# Patient Record
Sex: Male | Born: 1990
Health system: Southern US, Community
[De-identification: ages and names within clinical notes are randomized; demographics above are authoritative.]

## PROBLEM LIST (undated history)

## (undated) DIAGNOSIS — B20 Human immunodeficiency virus [HIV] disease: Secondary | ICD-10-CM

## (undated) HISTORY — PX: OTHER SURGICAL HISTORY: SHX169

## (undated) HISTORY — DX: Human immunodeficiency virus (HIV) disease: B20

## (undated) HISTORY — PX: TYMPANOSTOMY TUBE PLACEMENT: SHX32

---

## 2016-06-19 ENCOUNTER — Emergency Department (HOSPITAL_BASED_OUTPATIENT_CLINIC_OR_DEPARTMENT_OTHER): Payer: Self-pay

## 2016-06-19 ENCOUNTER — Encounter (HOSPITAL_BASED_OUTPATIENT_CLINIC_OR_DEPARTMENT_OTHER): Payer: Self-pay | Admitting: Emergency Medicine

## 2016-06-19 ENCOUNTER — Emergency Department (HOSPITAL_BASED_OUTPATIENT_CLINIC_OR_DEPARTMENT_OTHER)
Admission: EM | Admit: 2016-06-19 | Discharge: 2016-06-20 | Disposition: A | Discharge status: Home / Self Care | Payer: Self-pay | Attending: Emergency Medicine | Admitting: Emergency Medicine

## 2016-06-19 DIAGNOSIS — Y929 Unspecified place or not applicable: Secondary | ICD-10-CM | POA: Insufficient documentation

## 2016-06-19 DIAGNOSIS — W228XXA Striking against or struck by other objects, initial encounter: Secondary | ICD-10-CM | POA: Insufficient documentation

## 2016-06-19 DIAGNOSIS — Y999 Unspecified external cause status: Secondary | ICD-10-CM | POA: Insufficient documentation

## 2016-06-19 DIAGNOSIS — Y939 Activity, unspecified: Secondary | ICD-10-CM | POA: Insufficient documentation

## 2016-06-19 DIAGNOSIS — S20211A Contusion of right front wall of thorax, initial encounter: Secondary | ICD-10-CM | POA: Insufficient documentation

## 2016-06-19 MED ORDER — IBUPROFEN 800 MG PO TABS: 800.0000 mg | ORAL_TABLET | Freq: Three times a day (TID) | ORAL | 0 refills | Status: AC

## 2016-06-19 MED FILL — IBUPROFEN 800 MG TABLET: 800 | 7 days supply | Qty: 21 | Fill #0

## 2016-06-19 NOTE — ED Triage Notes (Signed)
Pt states he hit is rib on a counter about a week ago and started having pain about 4 days ago when he coughs and takes a deep breath.

## 2016-06-19 NOTE — ED Provider Notes (Signed)
[facility] Provider Note   CSN: [id number] Arrival date & time: 06/19/16  1301     History   Chief Complaint Chief Complaint  Patient presents with  . Chest Pain    HPI Bill Tran is a 25 y.o. male.  Patient is 25 yo M with no signficant PMH, presenting with right-sided rib pain after accidentally hitting his ribs on a counter approximately 1 week ago. The pain is intermittent, sharp, worse with taking a deep breath, with some associated shortness of breath. Denies fever, chills, cough, wheezing, hemoptysis, chest pain, swelling or pain in extremities. Also reports "breaking out with acne" over face, bilateral arms, and back for several days.      History reviewed. No pertinent past medical history.  There are no active problems to display for this patient.   Past Surgical History:  Procedure Laterality Date  . tubs         Home Medications    Prior to Admission medications   Medication Sig Start Date End Date Taking? Authorizing Provider  ibuprofen (ADVIL,MOTRIN) 800 MG tablet Take 1 tablet (800 mg total) by mouth 3 (three) times daily. 06/19/16   Sacha Topor F de Villier II, PA    Family History History reviewed. No pertinent family history.  Social History Social History  Substance Use Topics  . Smoking status: Never Smoker  . Smokeless tobacco: Never Used  . Alcohol use Yes     Allergies   Review of patient's allergies indicates no known allergies.   Review of Systems Review of Systems  All other systems reviewed and are negative.    Physical Exam Updated Vital Signs BP 110/67   Pulse 87   Temp 98.3 F (36.8 C) (Oral)   Resp 18   Ht 5\' 6"  (1.676 m)   Wt 63.5 kg   SpO2 98%   BMI 22.60 kg/m   Physical Exam  Constitutional: He appears well-developed and well-nourished. No distress.  HENT:  Head: Normocephalic and atraumatic.  Mouth/Throat: Oropharynx is clear and moist.  Eyes: Conjunctivae are normal.  Neck: Normal range  of motion.  Cardiovascular: Normal rate, regular rhythm, normal heart sounds and intact distal pulses.   Pulmonary/Chest: Effort normal and breath sounds normal. No respiratory distress. He exhibits tenderness (Tenderness to palpation of right lateral ribs, no crepitus).  Symmetric thoracic expansion, no flail chest noted  Abdominal: Soft. There is no tenderness.  Musculoskeletal: Normal range of motion. He exhibits no edema or tenderness.  Neurological: He is alert.  Skin: Skin is warm and dry.  Diffuse comedones and erythematous papules noted to face, bilateral arms, back, and chest  Psychiatric: He has a normal mood and affect.  Nursing note and vitals reviewed.    ED Treatments / Results  Labs (all labs ordered are listed, but only abnormal results are displayed) Labs Reviewed - No data to display  EKG  EKG Interpretation None       Radiology Dg Ribs Unilateral W/chest Right  Result Date: 06/19/2016 CLINICAL DATA:  Patient with right anterior rib pain for 4 days. Initial encounter. EXAM: RIGHT RIBS AND CHEST - 3+ VIEW COMPARISON:  None. FINDINGS: Normal cardiac and mediastinal contours. No consolidative pulmonary opacities. No pleural effusion or pneumothorax. No displaced rib fracture identified. IMPRESSION: No displaced rib fracture.  No acute cardiopulmonary process. Electronically Signed   By: Annia Belt M.D.   On: 06/19/2016 14:19    Procedures Procedures (including critical care time)  Medications Ordered in ED Medications -  No data to display   Initial Impression / Assessment and Plan / ED Course  I have reviewed the triage vital signs and the nursing notes.  Pertinent labs & imaging results that were available during my care of the patient were reviewed by me and considered in my medical decision making (see chart for details).  Clinical Course   Patient is 25 yo M complaining of right-sided rib pain after hitting his ribs on a counter 1 week ago. Pain is  reproducible on palpation of ribs. Negative x-ray of ribs and chest. Low risk factors for PE or acute pulmonary pathology. Likely rib contusion. Stable for d/c home with prescription for ibuprofen. Provided referral to [facility]. Advised to return to ED for worsening symptoms including shortness of breath, cough, chest pain, or skin changes.  Final Clinical Impressions(s) / ED Diagnoses   Final diagnoses:  Rib contusion, right, initial encounter    New Prescriptions New Prescriptions   IBUPROFEN (ADVIL,MOTRIN) 800 MG TABLET    Take 1 tablet (800 mg total) by mouth 3 (three) times daily.     Ivonne AndrewDaryl F de EldoradoVillier II, GeorgiaPA 06/19/16 2123    Charlynne Panderavid Hsienta Yao, MD 06/21/16 [phone]

## 2016-06-19 NOTE — ED Notes (Signed)
Patient transported to X-ray 

## 2017-04-24 IMAGING — CR DG RIBS W/ CHEST 3+V*R*
3 series · 3 of 3 positions shown · non-contrast
Comparison: None.

CLINICAL DATA: Patient with right anterior rib pain for 4 days.
Initial encounter.

EXAM:
RIGHT RIBS AND CHEST - 3+ VIEW

[w chest pa]
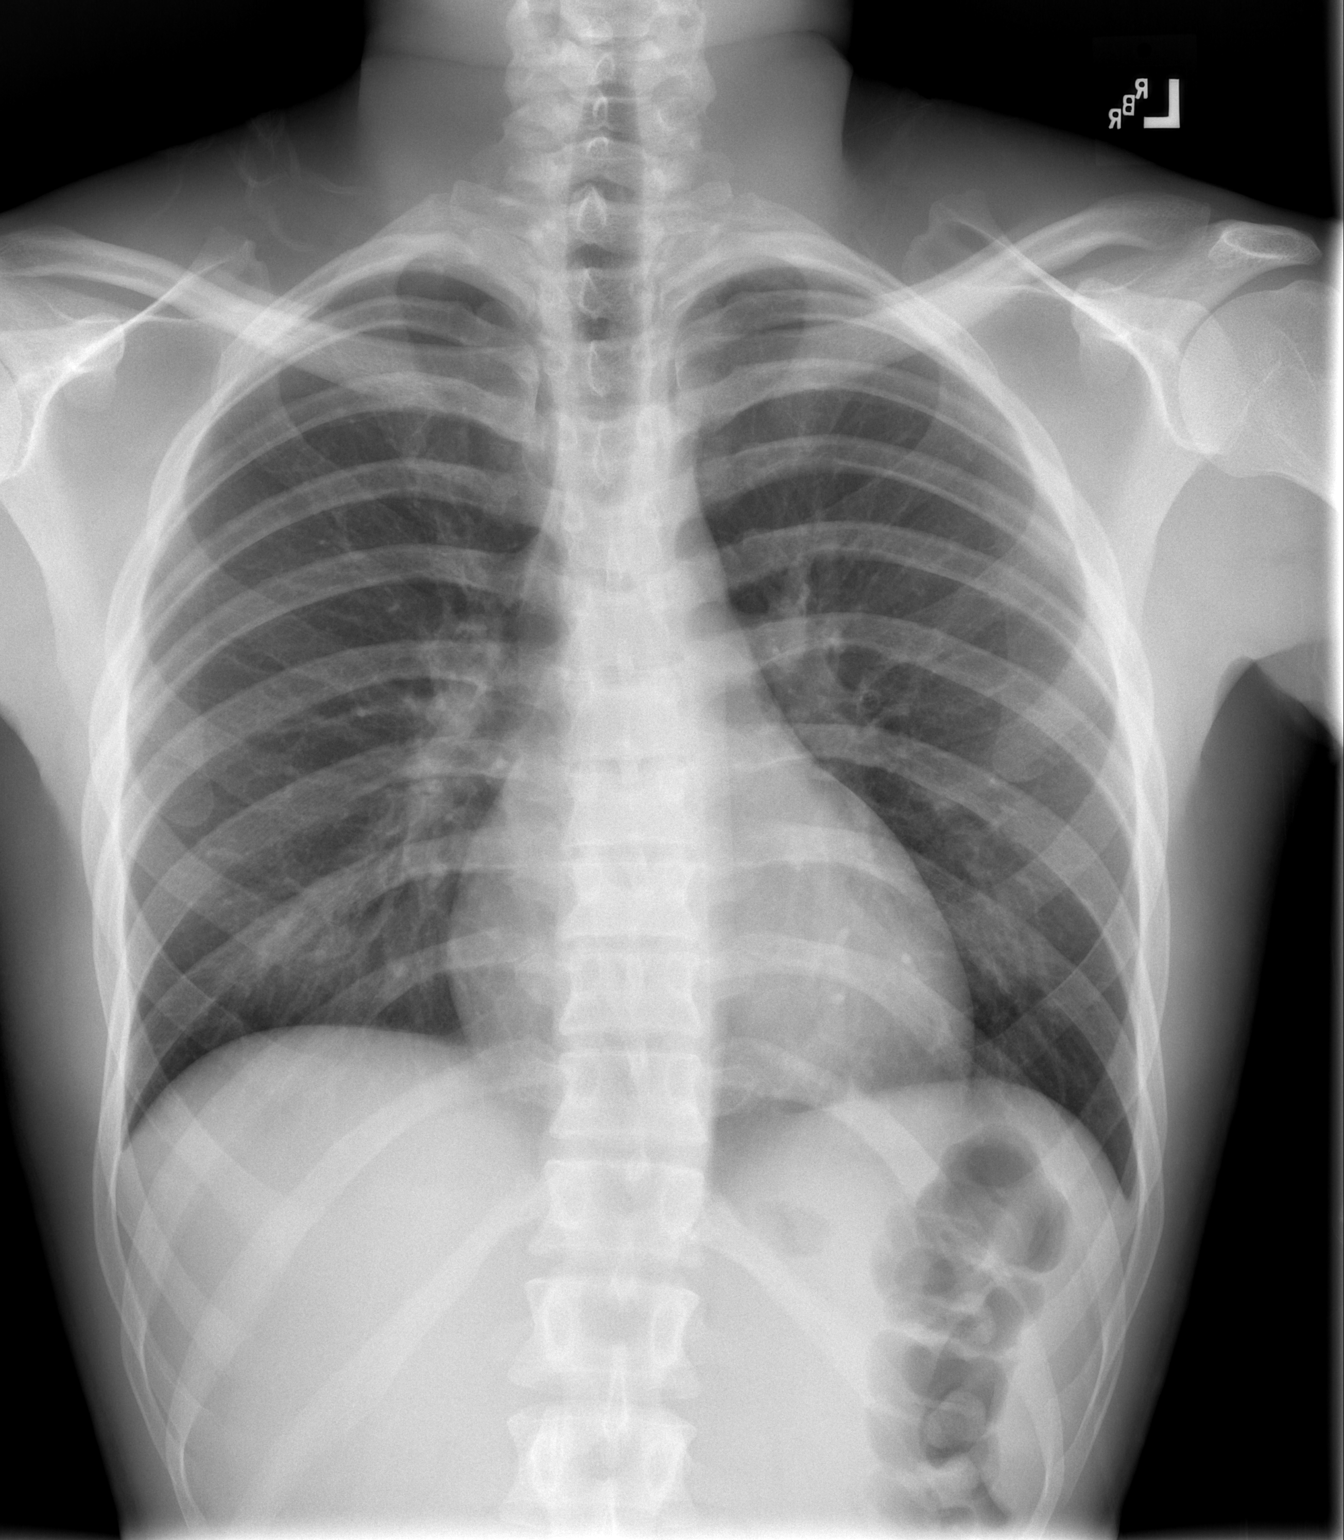

[w ribs ap/pa upper right]
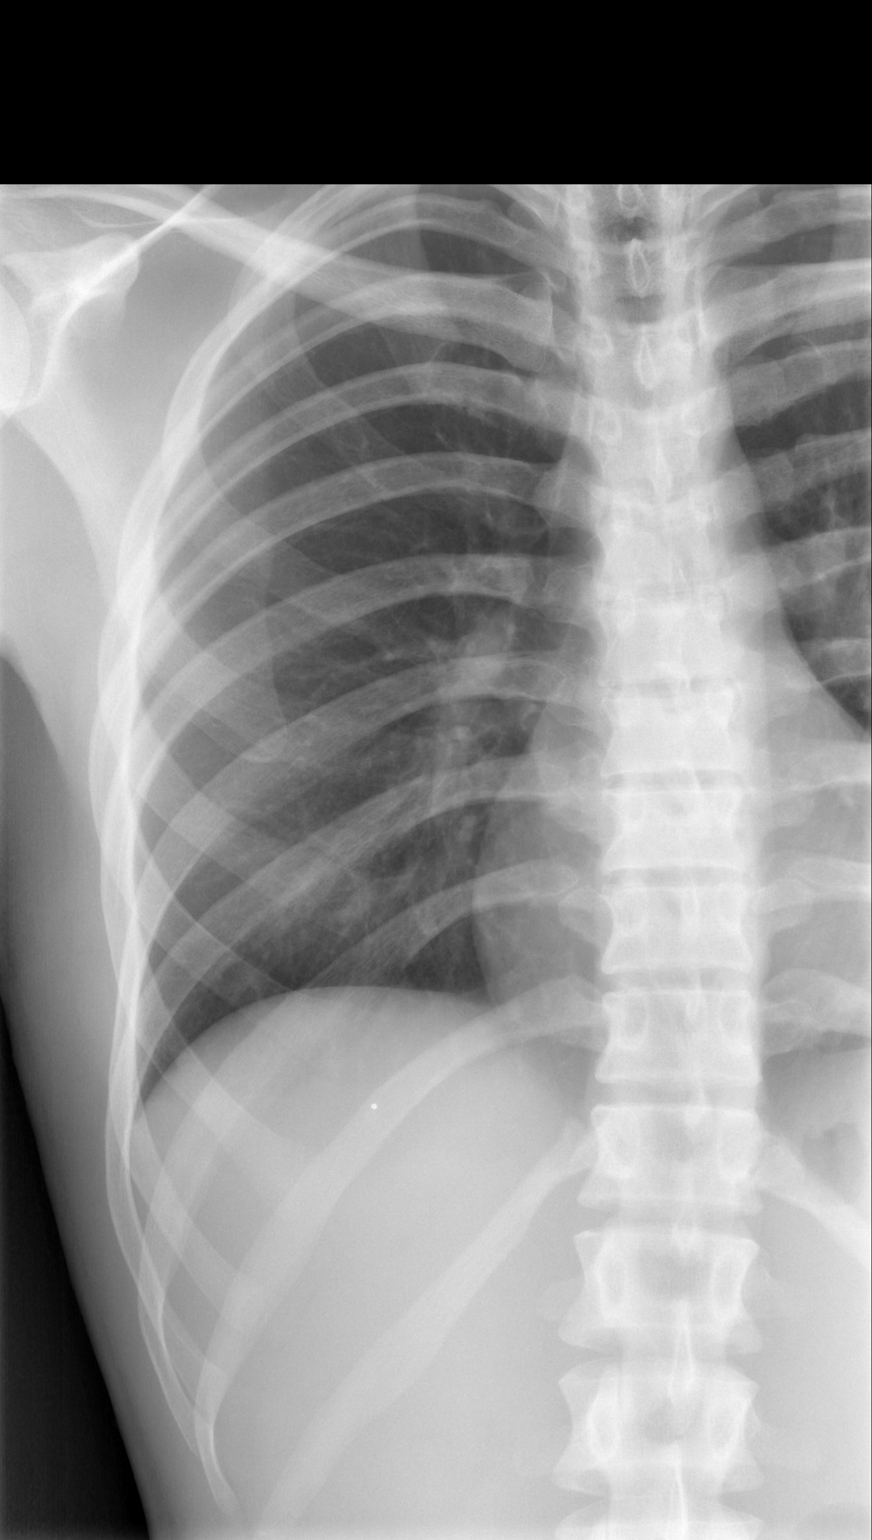

[w ribs oblique right *]
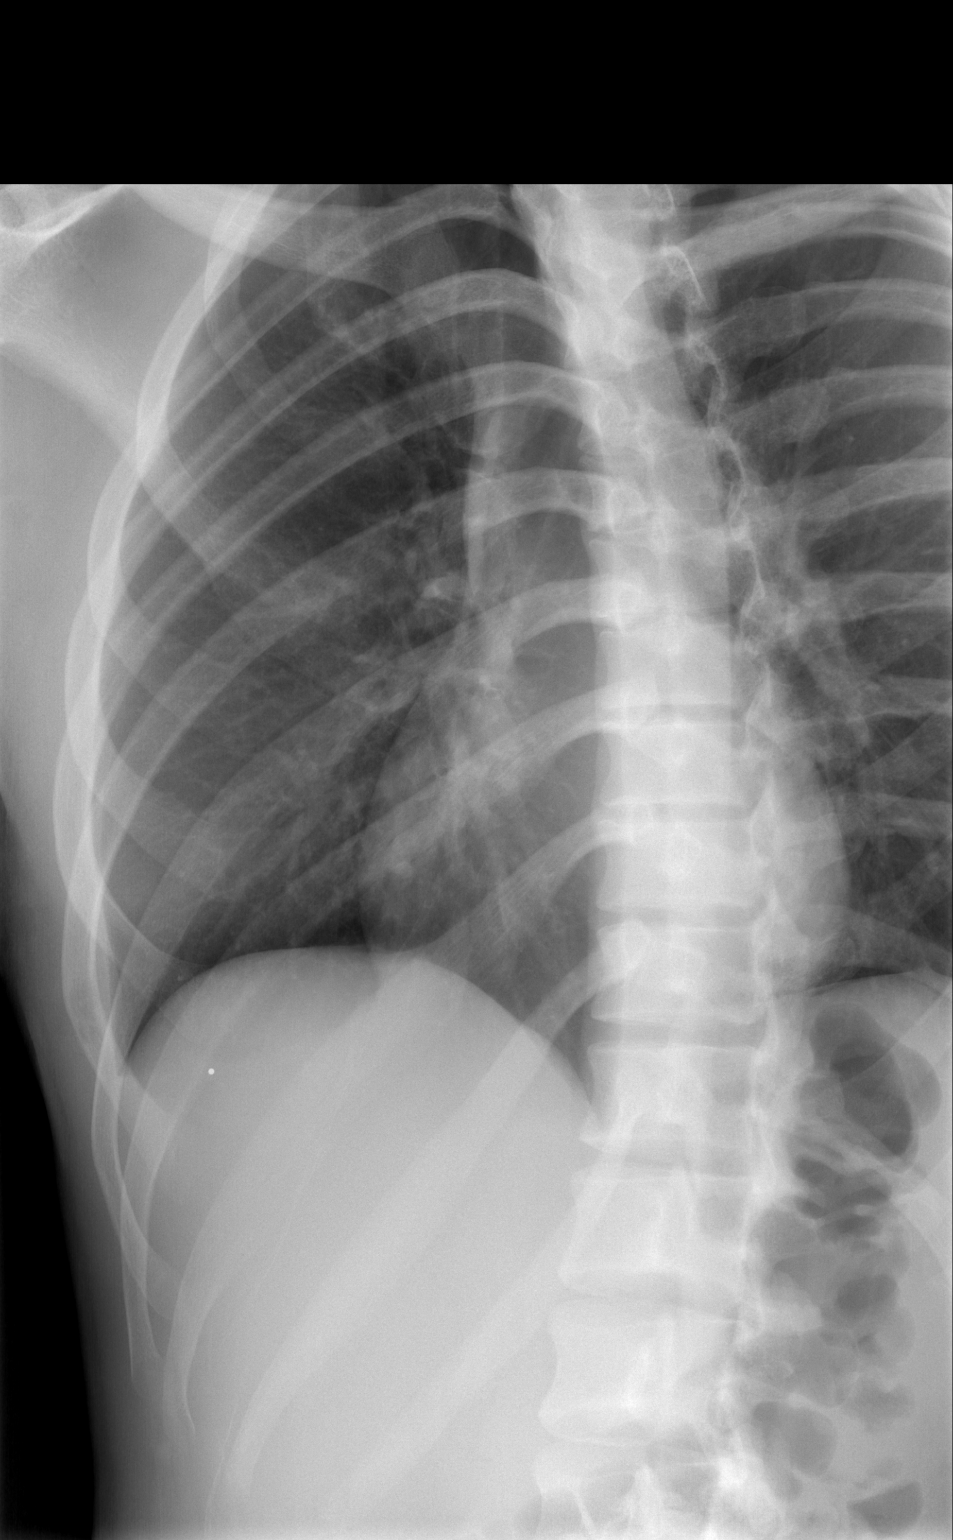

[3 of 3 positions shown; findings below may reference images not displayed]

FINDINGS: Normal cardiac and mediastinal contours. No consolidative pulmonary
opacities. No pleural effusion or pneumothorax. No displaced rib
fracture identified.
IMPRESSION: No displaced rib fracture.  No acute cardiopulmonary process.

## 2017-12-04 ENCOUNTER — Other Ambulatory Visit: Payer: Self-pay

## 2017-12-04 ENCOUNTER — Other Ambulatory Visit: Payer: Self-pay | Admitting: *Deleted

## 2017-12-04 ENCOUNTER — Ambulatory Visit: Payer: Self-pay

## 2017-12-04 ENCOUNTER — Encounter: Payer: Self-pay | Admitting: Infectious Diseases

## 2017-12-04 DIAGNOSIS — Z79899 Other long term (current) drug therapy: Secondary | ICD-10-CM

## 2017-12-04 DIAGNOSIS — B20 Human immunodeficiency virus [HIV] disease: Secondary | ICD-10-CM

## 2017-12-04 DIAGNOSIS — Z113 Encounter for screening for infections with a predominantly sexual mode of transmission: Secondary | ICD-10-CM

## 2017-12-05 LAB — URINALYSIS
Bilirubin Urine: NEGATIVE
Glucose, UA: NEGATIVE
HGB URINE DIPSTICK: NEGATIVE
Ketones, ur: NEGATIVE
Leukocytes, UA: NEGATIVE
NITRITE: NEGATIVE
PH: 8 (ref 5.0–8.0)
Protein, ur: NEGATIVE
Specific Gravity, Urine: 1.01 (ref 1.001–1.03)

## 2017-12-05 LAB — T-HELPER CELL (CD4) - (RCID CLINIC ONLY)
CD4 % Helper T Cell: 21 % — ABNORMAL LOW (ref 33–55)
CD4 T Cell Abs: 430 /uL (ref 400–2700)

## 2017-12-05 LAB — URINE CYTOLOGY ANCILLARY ONLY
Chlamydia: NEGATIVE
Neisseria Gonorrhea: NEGATIVE

## 2017-12-06 LAB — QUANTIFERON-TB GOLD PLUS
NIL: 0.05 [IU]/mL
QUANTIFERON-TB GOLD PLUS: NEGATIVE
TB1-NIL: 0.02 IU/mL
TB2-NIL: 0 IU/mL

## 2017-12-07 LAB — CBC WITH DIFFERENTIAL/PLATELET
BASOS PCT: 1 %
Basophils Absolute: 52 cells/uL (ref 0–200)
EOS ABS: 68 {cells}/uL (ref 15–500)
Eosinophils Relative: 1.3 %
HCT: 39.7 % (ref 38.5–50.0)
Hemoglobin: 13.8 g/dL (ref 13.2–17.1)
Lymphs Abs: 1820 cells/uL (ref 850–3900)
MCH: 31 pg (ref 27.0–33.0)
MCHC: 34.8 g/dL (ref 32.0–36.0)
MCV: 89.2 fL (ref 80.0–100.0)
MONOS PCT: 9.6 %
MPV: 10.1 fL (ref 7.5–12.5)
Neutro Abs: 2761 cells/uL (ref 1500–7800)
Neutrophils Relative %: 53.1 %
PLATELETS: 233 10*3/uL (ref 140–400)
RBC: 4.45 10*6/uL (ref 4.20–5.80)
RDW: 13.7 % (ref 11.0–15.0)
Total Lymphocyte: 35 %
WBC mixed population: 499 cells/uL (ref 200–950)
WBC: 5.2 10*3/uL (ref 3.8–10.8)

## 2017-12-07 LAB — COMPLETE METABOLIC PANEL WITH GFR
AG Ratio: 1.4 (calc) (ref 1.0–2.5)
ALBUMIN MSPROF: 4.3 g/dL (ref 3.6–5.1)
ALT: 20 U/L (ref 9–46)
AST: 19 U/L (ref 10–40)
Alkaline phosphatase (APISO): 59 U/L (ref 40–115)
BILIRUBIN TOTAL: 0.8 mg/dL (ref 0.2–1.2)
BUN: 11 mg/dL (ref 7–25)
CO2: 27 mmol/L (ref 20–32)
CREATININE: 0.67 mg/dL (ref 0.60–1.35)
Calcium: 9.1 mg/dL (ref 8.6–10.3)
Chloride: 106 mmol/L (ref 98–110)
GFR, EST AFRICAN AMERICAN: 154 mL/min/{1.73_m2} (ref >=60)
GFR, EST NON AFRICAN AMERICAN: 133 mL/min/{1.73_m2} (ref >=60)
GLUCOSE: 90 mg/dL (ref 65–99)
Globulin: 3.1 g/dL (calc) (ref 1.9–3.7)
Potassium: 4 mmol/L (ref 3.5–5.3)
Sodium: 139 mmol/L (ref 135–146)
TOTAL PROTEIN: 7.4 g/dL (ref 6.1–8.1)

## 2017-12-07 LAB — HEPATITIS A ANTIBODY, TOTAL: Hepatitis A AB,Total: NONREACTIVE

## 2017-12-07 LAB — HLA B*5701: HLA-B*5701 w/rflx HLA-B High: NEGATIVE

## 2017-12-07 LAB — LIPID PANEL
Cholesterol: 163 mg/dL (ref <200)
HDL: 61 mg/dL (ref >40)
LDL Cholesterol (Calc): 88 mg/dL (calc)
Non-HDL Cholesterol (Calc): 102 mg/dL (calc) (ref <130)
TRIGLYCERIDES: 55 mg/dL (ref <150)
Total CHOL/HDL Ratio: 2.7 (calc) (ref <5.0)

## 2017-12-07 LAB — HEPATITIS B CORE ANTIBODY, TOTAL: Hep B Core Total Ab: NONREACTIVE

## 2017-12-07 LAB — HEPATITIS B SURFACE ANTIGEN: HEP B S AG: NONREACTIVE

## 2017-12-07 LAB — HEPATITIS B SURFACE ANTIBODY,QUALITATIVE: HEP B S AB: REACTIVE — AB

## 2017-12-07 LAB — RPR: RPR Ser Ql: REACTIVE — AB

## 2017-12-07 LAB — FLUORESCENT TREPONEMAL AB(FTA)-IGG-BLD: Fluorescent Treponemal ABS: REACTIVE — AB

## 2017-12-07 LAB — HEPATITIS C ANTIBODY
Hepatitis C Ab: NONREACTIVE
SIGNAL TO CUT-OFF: 0.03 (ref <1.00)

## 2017-12-07 LAB — RPR TITER: RPR Titer: 1:32 {titer} — ABNORMAL HIGH

## 2017-12-09 ENCOUNTER — Telehealth: Payer: Self-pay | Admitting: *Deleted

## 2017-12-09 NOTE — Telephone Encounter (Signed)
Thank you for following up on this. He has had what I would consider an expected drop in titer with appropriate treatment. Will monitor further RPR titers in the next 3-6 months.   No further action at this time.

## 2017-12-09 NOTE — Telephone Encounter (Signed)
[facility]. Patient was listed as a contacted and tested 2/28 (1:64).  He received 1 of 1 dose bicillin 2.4 million units on 3/5. He had no signs/symptoms.  Andree CossHowell, Griffin Dewilde M, RN

## 2017-12-09 NOTE — Telephone Encounter (Signed)
-----   Message from Blanchard KelchStephanie N Dixon, NP sent at 12/06/2017  2:31 PM EDT ----- Syphilis test positive with high titer - can you please call the patient to determine if he has had any treatment? If he has never had treatment and he has no symptoms will need to treat with 2.4 million units IM bicillin weekly x 3. If he has had treatment we can discuss further at upcoming appointment.

## 2017-12-20 LAB — HIV-1 RNA ULTRAQUANT REFLEX TO GENTYP+
HIV 1 RNA QUANT: 2630 {copies}/mL — AB
HIV-1 RNA QUANT, LOG: 3.42 {Log_copies}/mL — AB

## 2017-12-20 LAB — RFLX HIV-1 INTEGRASE GENOTYPE: HIV-1 Genotype: DETECTED — AB

## 2017-12-23 ENCOUNTER — Encounter: Payer: Self-pay | Admitting: Infectious Diseases

## 2017-12-23 ENCOUNTER — Ambulatory Visit (INDEPENDENT_AMBULATORY_CARE_PROVIDER_SITE_OTHER): Payer: Self-pay | Admitting: Pharmacist Clinician (PhC)/ Clinical Pharmacy Specialist

## 2017-12-23 ENCOUNTER — Ambulatory Visit (INDEPENDENT_AMBULATORY_CARE_PROVIDER_SITE_OTHER): Payer: Self-pay | Admitting: Infectious Diseases

## 2017-12-23 VITALS — BP 121/80 | HR 70 | Temp 97.7°F | Ht 66.0 in | Wt 136.0 lb

## 2017-12-23 DIAGNOSIS — Z8619 Personal history of other infectious and parasitic diseases: Secondary | ICD-10-CM

## 2017-12-23 DIAGNOSIS — B20 Human immunodeficiency virus [HIV] disease: Secondary | ICD-10-CM

## 2017-12-23 DIAGNOSIS — Z0000 Encounter for general adult medical examination without abnormal findings: Secondary | ICD-10-CM

## 2017-12-23 DIAGNOSIS — Z23 Encounter for immunization: Secondary | ICD-10-CM

## 2017-12-23 MED ORDER — BICTEGRAVIR-EMTRICITAB-TENOFOV 50-200-25 MG PO TABS: 1.0000 | ORAL_TABLET | Freq: Every day | ORAL | 1 refills | Status: DC

## 2017-12-23 MED ORDER — BICTEGRAVIR-EMTRICITAB-TENOFOV 50-200-25 MG PO TABS: 1.0000 | ORAL_TABLET | Freq: Every day | ORAL | 5 refills | Status: DC

## 2017-12-23 MED FILL — BIKTARVY 50-200-25 MG TABS: 50-200-25 | 30 days supply | Qty: 30 | Fill #0

## 2017-12-23 NOTE — Progress Notes (Signed)
Name: Bill Tran  DOB: 1990/12/01 MRN: [id number] PCP: Patient, No Pcp Per   Chief Complaint  Patient presents with  . New Patient (Initial Visit)    New HIV      Patient Active Problem List   Diagnosis Date Noted  . HIV (human immunodeficiency virus infection) (HCC) 12/23/2017  . History of syphilis 12/23/2017  . Healthcare maintenance 12/23/2017     Brief Narrative:  Bill Tran is a 27 y.o. male with HIV infection. Originally diagnosed with HIV in 11/2017. History of OIs: none. HIV Risk: MSM.   Previous Regimens:   none  Genotype:   11/2017 - K103N   Subjective:  Bill Tran  is here today to establish care for his newly diagnosed HIV infection. He learned of his positive status in March of this year after he went for STI testing where he was also found to have a positive syphilis titer and [facility] He last tested negative for HIV in 2017. No symptoms regarding his health that he is concerned with aside from some general itching that happens on occasion about 1x daily that has been going on for a week. He is in a long term relationship with a male partner for the last 3 years whom is also a patient in our PrEP clinic.   He has no other significant pmhx to add today.   Chaske works at [employer] full time and enjoys traveling; presently trying to plan a trip to [place]. He has not had the flu vaccine this year and has not in years past.   Review of Systems  Constitutional: Negative for chills, fever, malaise/fatigue and weight loss.  HENT: Negative for sore throat.        No dental problems  Respiratory: Negative for cough and sputum production.   Cardiovascular: Negative for chest pain and leg swelling.  Gastrointestinal: Negative for abdominal pain, diarrhea and vomiting.  Genitourinary: Negative for dysuria and flank pain.  Musculoskeletal: Negative for joint pain, myalgias and neck pain.  Skin: Negative  for rash.  Neurological: Negative for dizziness, tingling and headaches.  Psychiatric/Behavioral: Negative for depression and substance abuse. The patient is not nervous/anxious and does not have insomnia.     Past Medical History:  Diagnosis Date  . HIV infection [facility])     Outpatient Medications Prior to Visit  Medication Sig Dispense Refill  . ibuprofen (ADVIL,MOTRIN) 800 MG tablet Take 1 tablet (800 mg total) by mouth 3 (three) times daily. 21 tablet 0   No facility-administered medications prior to visit.      No Known Allergies  Social History   Tobacco Use  . Smoking status: Never Smoker  . Smokeless tobacco: Never Used  Substance Use Topics  . Alcohol use: Yes    Comment: on the weekends  . Drug use: Yes    Types: Marijuana    Comment: daily     Family History  Problem Relation Age of Onset  . Kidney failure Mother   . Diabetes Mother   . Hypertension Mother     Social History   Substance and Sexual Activity  Sexual Activity Not Currently  . Birth control/protection: Condom, None    Objective:   Vitals:   12/23/17 1014  BP: 121/80  Pulse: 70  Temp: 97.7 F (36.5 C)  TempSrc: Oral  Weight: 136 lb (61.7 kg)  Height: 5\' 6"  (1.676 m)   Body mass index is 21.95 kg/m.  Physical  Exam  Constitutional: He is oriented to person, place, and time and well-developed, well-nourished, and in no distress.  Seated in chair comfortably. In good spirits.   HENT:  Mouth/Throat: No oral lesions. Normal dentition. No dental caries.  Eyes: No scleral icterus.  Cardiovascular: Normal rate, regular rhythm and normal heart sounds.  Pulmonary/Chest: Effort normal and breath sounds normal.  Abdominal: Soft. He exhibits no distension. There is no tenderness.  Lymphadenopathy:    He has no cervical adenopathy.  Neurological: He is alert and oriented to person, place, and time.  Skin: Skin is warm and dry. No rash noted.  Psychiatric: Mood and affect normal.  Vitals  reviewed.   Lab Results Lab Results  Component Value Date   WBC 5.2 12/04/2017   HGB 13.8 12/04/2017   HCT 39.7 12/04/2017   MCV 89.2 12/04/2017   PLT 233 12/04/2017    Lab Results  Component Value Date   CREATININE 0.67 12/04/2017   BUN 11 12/04/2017   NA 139 12/04/2017   K 4.0 12/04/2017   CL 106 12/04/2017   CO2 27 12/04/2017    Lab Results  Component Value Date   ALT 20 12/04/2017   AST 19 12/04/2017   BILITOT 0.8 12/04/2017    Lab Results  Component Value Date   CHOL 163 12/04/2017   HDL 61 12/04/2017   LDLCALC 88 12/04/2017   TRIG 55 12/04/2017   CHOLHDL 2.7 12/04/2017   HIV 1 RNA Quant (Copies/mL)  Date Value  12/04/2017 2,630 (H)   CD4 T Cell Abs (/uL)  Date Value  12/04/2017 430   No results found for: HAV Lab Results  Component Value Date   HEPBSAG NON-REACTIVE 12/04/2017   HEPBSAB REACTIVE (A) 12/04/2017   No results found for: HCVAB Lab Results  Component Value Date   CHLAMYDIAWP Negative 12/04/2017   N Negative 12/04/2017   No results found for: GCPROBEAPT No results found for: QUANTGOLD No results found for: RPR   Assessment & Plan:   Problem List Items Addressed This Visit      Other   HIV (human immunodeficiency virus infection) (HCC) - Primary    New patient here to establish for HIV care. I discussed with [facility] treatment options/side effects, benefits of treatment and long-term outcomes. I discussed how HIV is transmitted and the process of untreated HIV including increased risk for opportunistic infections, cancer, dementia and renal failure. he was counseled on routine HIV care including medication adherence, blood monitoring, necessary vaccines and follow up visits. Counseled regarding safe sex practices including: condom use, partner disclosure, limiting partners. he spent time talking with our pharmacist Minh regarding successful practices of ART and understands to reach out to our clinic in the future with questions.    I decided to start him on BIKTARVY today. He will receive medications from [facility] for now until his ADAP is approved.  Will return to clinic in 4 weeks to review lab work and continue HIV education with our pharmacy team and receive first Menveo vaccine.   I spent greater than 45 minutes with the patient today. Greater than 50% of the time spent face-to-face counseling and coordination of care re: HIV and health maintenance.        Relevant Orders   Pneumococcal polysaccharide vaccine 23-valent greater than or equal to 2yo subcutaneous/IM (Completed)   History of syphilis    Titer 1:32 with recent lab draw. No active symptoms. Counseled on potential for re-infectivity and potential for  prolonged positive serofast state. Will follow titer in 4-6 months to ensure appropriate decrease and call state lab to get his treatment titer.       Healthcare maintenance    Overall in good health without significant co-morbidities. Hep A/B immune.  Referral to dental clinic today.  Pneumovax today.   Future vaccines to include Menveo and HPV.  Counseled on yearly flu vaccine.        Other Visit Diagnoses    Need for pneumococcal vaccination       Relevant Orders   Pneumococcal polysaccharide vaccine 23-valent greater than or equal to 2yo subcutaneous/IM (Completed)     Rexene AlbertsStephanie Dixon, MSN, NP-C [facility] Pager: [phone] Office: [phone]  12/23/17  1:28 PM

## 2017-12-23 NOTE — Progress Notes (Signed)
HPI: Bill Tran is a 27 y.o. male who is here to see Bill CornfieldStephanie for his initial HIV visit.   Allergies: No Known Allergies  Vitals: Temp: 97.7 F (36.5 C) (04/01 1014) Temp Source: Oral (04/01 1014) BP: 121/80 (04/01 1014) Pulse Rate: 70 (04/01 1014)  Past Medical History: Past Medical History:  Diagnosis Date  . HIV infection [facility](HCC)     Social History: Social History   Socioeconomic History  . Marital status: Single    Spouse name: Not on file  . Number of children: Not on file  . Years of education: Not on file  . Highest education level: Not on file  Occupational History  . Not on file  Social Needs  . Financial resource strain: Not on file  . Food insecurity:    Worry: Never true    Inability: Never true  . Transportation needs:    Medical: Yes    Non-medical: Not on file  Tobacco Use  . Smoking status: Never Smoker  . Smokeless tobacco: Never Used  Substance and Sexual Activity  . Alcohol use: Yes    Comment: on the weekends  . Drug use: Yes    Types: Marijuana    Comment: daily   . Sexual activity: Not Currently    Birth control/protection: Condom, None  Lifestyle  . Physical activity:    Days per week: 2 days    Minutes per session: Not on file  . Stress: Only a little  Relationships  . Social connections:    Talks on phone: Not on file    Gets together: Not on file    Attends religious service: Not on file    Active member of club or organization: Not on file    Attends meetings of clubs or organizations: Not on file    Relationship status: Not on file  Other Topics Concern  . Not on file  Social History Narrative  . Not on file    Previous Regimen: None   Current Regimen: None  Labs: HIV 1 RNA Quant (Copies/mL)  Date Value  12/04/2017 2,630 (H)   CD4 T Cell Abs (/uL)  Date Value  12/04/2017 430   Hep B S Ab (no units)  Date Value  12/04/2017 REACTIVE (A)   Hepatitis B Surface Ag (no units)  Date Value  12/04/2017  NON-REACTIVE    CrCl: Estimated Creatinine Clearance: 122.1 mL/min (by C-G formula based on SCr of 0.67 mg/dL).  Lipids:    Component Value Date/Time   CHOL 163 12/04/2017 1108   TRIG 55 12/04/2017 1108   HDL 61 12/04/2017 1108   CHOLHDL 2.7 12/04/2017 1108   LDLCALC 88 12/04/2017 1108   HIV Genotype Composite Data Genotype Dates:   Mutations in Bold impact drug susceptibility RT Mutations K103N  PI Mutations None  Integrase Mutations None   Interpretation of Genotype Data per [employer] HIV Database Nucleoside RTIs  abacavir (ABC) Susceptible zidovudine (AZT) Susceptible emtricitabine (FTC) Susceptible lamivudine (3TC) Susceptible tenofovir (TDF) Susceptible   Non-Nucleoside RTIs  doravirine (DOR) Susceptible efavirenz (EFV) High-Level Resistance etravirine (ETR) Susceptible nevirapine (NVP) High-Level Resistance rilpivirine (RPV) Susceptible   Protease Inhibitors  None   Integrase Inhibitors  None    Assessment: Bill Tran was a recently dx patient and is here today for the initial visit. He is an MSM patient who is in a relationship with a PrEP patient here now. His genotype has come back positive for K103N. He has applied for ADAP about 2 wks ago but  would like to start on antiretroviral today. We will go through [facility] to get him the meds and fax in the application to get the beyond thirty day supply. He knows that he will have to pick up the future supply at [facility] once his ADAP is approved.   He was positive for syphilis and had received 1 dose of bicillin at the [facility]. Titer has appropriately dropped from 1:64>>1:32  Recommendations:  Start Biktarvy 1 PO qday - future will be at [facility] F/u back up with pharmacy in a month   Bill Tran, PharmD, BCPS, AAHIVP, CPP Clinical Infectious Disease Pharmacist [facility] 12/23/2017, 11:46 AM

## 2017-12-23 NOTE — Assessment & Plan Note (Addendum)
Overall in good health without significant co-morbidities. Hep A/B immune.  Referral to dental clinic today.  Pneumovax today.   Future vaccines to include Menveo and HPV.  Counseled on yearly flu vaccine.

## 2017-12-23 NOTE — Assessment & Plan Note (Signed)
Titer 1:32 with recent lab draw. No active symptoms. Counseled on potential for re-infectivity and potential for prolonged positive serofast state. Will follow titer in 4-6 months to ensure appropriate decrease and call state lab to get his treatment titer.

## 2017-12-23 NOTE — Patient Instructions (Addendum)
Nice to meet you!  Biktarvy is the pill for your HIV infection - this will need to be taken once a day around the same time.  - Common side effects for a short time frame usually include headaches, nausea and diarrhea - OK to take over the counter tylenol for headaches and imodium for diarrhea - Try taking with food if you are nauseated  - You can continue taking your multivitamin but just separate it by 6 hours before or after your biktarvy.  - For over the counter pain medications please use tylenol as a first option.   We gave you your pneumonia vaccine today.  Abstinence or condoms with sexual encounters for now until we get your virus undetectable.   Please return in 4 weeks to see our pharmacy team.

## 2017-12-23 NOTE — BH Specialist Note (Signed)
Integrated Behavioral Health Initial Visit  MRN: [id number]   Name: Bill Tran   Session Start time: 11:16am  Session End time: 11:29am   Total time: 15 minutes  Type of Service: Integrated Behavioral Health- Individual/Family Interpretor:No. Interpretor Name and Language:    Warm Hand Off Completed.       SUBJECTIVE: Bill Tran is a 27 y.o. male. Patient reports the following symptoms/concerns: difficulty adjusting to recent diagnosis.  Severity of problem: mild  OBJECTIVE: Mood: Euthymic and Affect: Constricted Risk of harm to self or others: No plan to harm self or others  LIFE CONTEXT: Family and Social: Bill Tran reports he has a great support system of family and friends.  School/Work:  works at [employer], and is fairly happy with this situation Self-Care: spending time with friends, spending time with family,  Life Changes: recent diagnosis is only life change  GOALS ADDRESSED: Patient will: 1. Receive information about counseling services and be offered the opportunity to talk with counselor at length.   INTERVENTIONS: Interventions utilized: Psychoeducation and/or Health Education  Counselor explained to patient the services available to him and encouraged him to make use of the services as needed. Counselor normalized mood shifts during the adjustment to a new diagnosis, and reviewed with him resources available both clinically and through social support.   ASSESSMENT: Patient currently experiencing confusion and difficult adjustment to recent diagnosis. He denies any thoughts of harm to self or others and reports he does not feel as depressed as others seem to expect from him.   Patient may benefit from counseling as needed to address adjustment and impact of diagnosis on emotional/mental experiences.    PLAN: 1.Client agreed to contact the office to make an appointment in the future if the need arises.   Angus Palmsegina Prabhjot Piscitello, LCSW

## 2017-12-23 NOTE — Assessment & Plan Note (Signed)
New patient here to establish for HIV care. [facility] treatment options/side effects, benefits of treatment and long-term outcomes. I discussed how HIV is transmitted and the process of untreated HIV including increased risk for opportunistic infections, cancer, dementia and renal failure. he was counseled on routine HIV care including medication adherence, blood monitoring, necessary vaccines and follow up visits. Counseled regarding safe sex practices including: condom use, partner disclosure, limiting partners. he spent time talking with our pharmacist Minh regarding successful practices of ART and understands to reach out to our clinic in the future with questions.   I decided to start him on BIKTARVY today. [facility] for now until his ADAP is approved.  Will return to clinic in 4 weeks to review lab work and continue HIV education with our pharmacy team and receive first Menveo vaccine.   I spent greater than 45 minutes with the patient today. Greater than 50% of the time spent face-to-face counseling and coordination of care re: HIV and health maintenance.

## 2018-01-20 ENCOUNTER — Ambulatory Visit (INDEPENDENT_AMBULATORY_CARE_PROVIDER_SITE_OTHER): Payer: Self-pay | Admitting: Pharmacist Clinician (PhC)/ Clinical Pharmacy Specialist

## 2018-01-20 ENCOUNTER — Encounter: Payer: Self-pay | Admitting: Infectious Diseases

## 2018-01-20 DIAGNOSIS — Z23 Encounter for immunization: Secondary | ICD-10-CM

## 2018-01-20 DIAGNOSIS — B20 Human immunodeficiency virus [HIV] disease: Secondary | ICD-10-CM

## 2018-01-20 LAB — BASIC METABOLIC PANEL
BUN: 13 mg/dL (ref 7–25)
CO2: 28 mmol/L (ref 20–32)
Calcium: 9.4 mg/dL (ref 8.6–10.3)
Chloride: 106 mmol/L (ref 98–110)
Creat: 1 mg/dL (ref 0.60–1.35)
GLUCOSE: 99 mg/dL (ref 65–99)
POTASSIUM: 4.1 mmol/L (ref 3.5–5.3)
SODIUM: 141 mmol/L (ref 135–146)

## 2018-01-20 NOTE — Progress Notes (Addendum)
HPI: Bill Tran is a 27 y.o. male who presents to [facility] pharmacy clinic for HIV follow up. He started [employer] about 1 month ago.   Allergies: No Known Allergies  Vitals:    Past Medical History: Past Medical History:  Diagnosis Date  . HIV infection [facility])     Social History: Social History   Socioeconomic History  . Marital status: Single    Spouse name: Not on file  . Number of children: Not on file  . Years of education: Not on file  . Highest education level: Not on file  Occupational History  . Not on file  Social Needs  . Financial resource strain: Not on file  . Food insecurity:    Worry: Never true    Inability: Never true  . Transportation needs:    Medical: Yes    Non-medical: Not on file  Tobacco Use  . Smoking status: Never Smoker  . Smokeless tobacco: Never Used  Substance and Sexual Activity  . Alcohol use: Yes    Comment: on the weekends  . Drug use: Yes    Types: Marijuana    Comment: daily   . Sexual activity: Not Currently    Birth control/protection: Condom, None  Lifestyle  . Physical activity:    Days per week: 2 days    Minutes per session: Not on file  . Stress: Only a little  Relationships  . Social connections:    Talks on phone: Not on file    Gets together: Not on file    Attends religious service: Not on file    Active member of club or organization: Not on file    Attends meetings of clubs or organizations: Not on file    Relationship status: Not on file  Other Topics Concern  . Not on file  Social History Narrative  . Not on file    Previous Regimen: none  Current Regimen: Biktarvy  Labs: HIV 1 RNA Quant (Copies/mL)  Date Value  12/04/2017 2,630 (H)   CD4 T Cell Abs (/uL)  Date Value  12/04/2017 430   Hep B S Ab (no units)  Date Value  12/04/2017 REACTIVE (A)   Hepatitis B Surface Ag (no units)  Date Value  12/04/2017 NON-REACTIVE    CrCl: CrCl cannot be calculated (Patient's most recent lab result  is older than the maximum 21 days allowed.).  Lipids:    Component Value Date/Time   CHOL 163 12/04/2017 1108   TRIG 55 12/04/2017 1108   HDL 61 12/04/2017 1108   CHOLHDL 2.7 12/04/2017 1108   LDLCALC 88 12/04/2017 1108    Assessment: Bill Tran is here today for HIV follow up, accompanied by his boyfriend who is on PrEP. Bill Tran was diagnosed last month and started [place]. He has not missed any doses and does not report any side effects. We discussed the importance of continued compliance to treat his disease and prevent resistance. He was planning to pick up his first refill today. His ADAP is now approved so he will pick up today at [facility]. He needs vaccines for hepatitis A, meningococcus and tetanus.   Recommendations: Continue Biktarvy 1 PO daily HIV RNA, RPR and BMP today Menveo, Hep A and TDaP vaccines given in clinic today F/u with Bill Tran on 03/18/18 - 2nd Menveo vaccine at that time   Bill Tran, PharmD PGY1 Pharmacy Resident [facility] 01/20/18 9:50 AM   Agreed with Bill Tran's note. He seems very compliant to his  therapy. He will pick up Biktarvy at [facility] after the visit. He knows that he will need to renew his ADAP in July.   Bill Tran, PharmD, BCPS, AAHIVP, CPP Infectious Disease Pharmacist Pager: [phone] 01/20/2018 10:43 AM

## 2018-01-21 LAB — RPR: RPR: REACTIVE — AB

## 2018-01-21 LAB — RPR TITER: RPR Titer: 1:16 {titer} — ABNORMAL HIGH

## 2018-01-21 LAB — FLUORESCENT TREPONEMAL AB(FTA)-IGG-BLD: FLUORESCENT TREPONEMAL ABS: REACTIVE — AB

## 2018-01-22 LAB — HIV-1 RNA QUANT-NO REFLEX-BLD
HIV 1 RNA QUANT: NOT DETECTED {copies}/mL
HIV-1 RNA Quant, Log: <1.3 Log copies/mL

## 2018-01-30 ENCOUNTER — Encounter: Payer: Self-pay | Admitting: Behavioral Health

## 2018-03-18 ENCOUNTER — Encounter: Payer: Self-pay | Admitting: Infectious Diseases

## 2018-03-18 ENCOUNTER — Ambulatory Visit (INDEPENDENT_AMBULATORY_CARE_PROVIDER_SITE_OTHER): Payer: Self-pay | Admitting: Infectious Diseases

## 2018-03-18 VITALS — BP 114/76 | HR 61 | Temp 98.1°F | Resp 16 | Ht 66.0 in | Wt 134.0 lb

## 2018-03-18 DIAGNOSIS — Z21 Asymptomatic human immunodeficiency virus [HIV] infection status: Secondary | ICD-10-CM

## 2018-03-18 DIAGNOSIS — Z8619 Personal history of other infectious and parasitic diseases: Secondary | ICD-10-CM

## 2018-03-18 DIAGNOSIS — Z0000 Encounter for general adult medical examination without abnormal findings: Secondary | ICD-10-CM

## 2018-03-18 DIAGNOSIS — Z23 Encounter for immunization: Secondary | ICD-10-CM

## 2018-03-18 NOTE — Assessment & Plan Note (Addendum)
Excellent adherence on his Biktarvy - recently undetectable with lab results 1 month prior. We will continue this medication for him. No need for OI prophylaxis. We discussed HMAP enrollment periods today. Discussed U=U concept in addition to safe sex counseling and prevention of other STIs.  He can return to clinic in 4 months for routine care. We can do labs that same day considering his 90 min drive and he uses his mychart. Will check VL/CD4 at that time for HIV monitoring.

## 2018-03-18 NOTE — Assessment & Plan Note (Signed)
Menveo #2 today and Hep A #2 today --> will check Ab at future visit.  Prevnar due 12/2018.  Will discuss HPV at upcoming appointment.

## 2018-03-18 NOTE — Addendum Note (Signed)
Addended by: Osa CraverGUIJOZA, Enrica Corliss M on: 03/18/2018 09:53 AM   Modules accepted: Orders

## 2018-03-18 NOTE — Patient Instructions (Addendum)
Please set up an appointment with Olegario MessierKathy our financial coordinator to re-apply for your HIV insurance.   Please bring 2 pay stubs and your bills from the lab that you need us to help with.   U=U is the campaign if you would like to research it.   Please come back to see [name] in 4 months - we will do labs same day at the visit.

## 2018-03-18 NOTE — Assessment & Plan Note (Signed)
RPR 1:16 3725m ago which is 2-fold drop from our first RPR. Not exactly certain what initial treatment titer was. Will re-assess at next visit in 4 months to look for decreasing titer. No signs of re-infection and no new sexual partners.

## 2018-03-18 NOTE — Progress Notes (Signed)
Name: Bill Tran  DOB: [birth year] MRN: [id number] PCP: Patient, No Pcp Per   Chief Complaint  Patient presents with  . HIV Positive/AIDS  . Immunizations    Menveo     Patient Active Problem List   Diagnosis Date Noted  . HIV (human immunodeficiency virus infection) (HCC) 12/23/2017  . History of syphilis 12/23/2017  . Healthcare maintenance 12/23/2017     Brief Narrative:  Bill Tran is a 27 y.o. male with HIV infection. Originally diagnosed with HIV in 11/2017. History of OIs: none. HIV Risk: MSM.   Previous Regimens:   none  Genotype:   11/2017 - K103N   Subjective:  Bill Tran  is here today for follow up on his HIV disease. He is doing well with his Biktarvy and has not missed a dose since our last meeting together. He uses a pill tray and an alarm which serves him well. He is still in a monogamous relationship with a male partner of 3 years whom is also on PrEP. Reports no complaints today suggestive of associated opportunistic infection or advancing HIV disease such as fevers, night sweats, weight loss, anorexia, cough, SOB, nausea, vomiting, diarrhea, headache, sensory changes, lymphadenopathy or oral thrush. He has some lab bills he is concerned about and also questions about re-applying for HMAP.   He has no other significant pmhx to update today.    Review of Systems  Constitutional: Negative for chills, fever, malaise/fatigue and weight loss.  HENT: Negative for sore throat.        No dental problems  Respiratory: Negative for cough and sputum production.   Cardiovascular: Negative for chest pain and leg swelling.  Gastrointestinal: Negative for abdominal pain, diarrhea and vomiting.  Genitourinary: Negative for dysuria and flank pain.  Musculoskeletal: Negative for joint pain, myalgias and neck pain.  Skin: Negative for rash.  Neurological: Negative for dizziness, tingling and headaches.  Psychiatric/Behavioral: Negative for depression and substance  abuse. The patient is not nervous/anxious and does not have insomnia.     Past Medical History:  Diagnosis Date  . HIV infection [facility])     Outpatient Medications Prior to Visit  Medication Sig Dispense Refill  . bictegravir-emtricitabine-tenofovir AF (BIKTARVY) 50-200-25 MG TABS tablet Take 1 tablet by mouth daily. Try to take at the same time each day with or without food. 30 tablet 5  . ibuprofen (ADVIL,MOTRIN) 800 MG tablet Take 1 tablet (800 mg total) by mouth 3 (three) times daily. 21 tablet 0   No facility-administered medications prior to visit.      No Known Allergies  Social History   Tobacco Use  . Smoking status: Never Smoker  . Smokeless tobacco: Never Used  Substance Use Topics  . Alcohol use: Yes    Comment: on the weekends  . Drug use: Yes    Types: Marijuana    Comment: daily     Family History  Problem Relation Age of Onset  . Kidney failure Mother   . Diabetes Mother   . Hypertension Mother     Social History   Substance and Sexual Activity  Sexual Activity Not Currently  . Birth control/protection: Condom, None    Objective:   Vitals:   03/18/18 0857  BP: 114/76  Pulse: 61  Resp: 16  Temp: 98.1 F (36.7 C)  TempSrc: Oral  SpO2: 99%  Weight: 134 lb (60.8 kg)  Height: 5\' 6"  (1.676 m)   Body mass index is 21.63 kg/m.  Physical Exam  Constitutional:  He is oriented to person, place, and time and well-developed, well-nourished, and in no distress.  Seated in chair comfortably. In good spirits.   HENT:  Mouth/Throat: No oral lesions. Normal dentition. No dental caries.  Eyes: No scleral icterus.  Cardiovascular: Normal rate, regular rhythm and normal heart sounds.  Pulmonary/Chest: Effort normal and breath sounds normal.  Abdominal: Soft. He exhibits no distension. There is no tenderness.  Lymphadenopathy:    He has no cervical adenopathy.  Neurological: He is alert and oriented to person, place, and time.  Skin: Skin is warm and  dry. No rash noted.  Psychiatric: Mood and affect normal.  Vitals reviewed.   Lab Results Lab Results  Component Value Date   WBC 5.2 12/04/2017   HGB 13.8 12/04/2017   HCT 39.7 12/04/2017   MCV 89.2 12/04/2017   PLT 233 12/04/2017    Lab Results  Component Value Date   CREATININE 1.00 01/20/2018   BUN 13 01/20/2018   NA 141 01/20/2018   K 4.1 01/20/2018   CL 106 01/20/2018   CO2 28 01/20/2018    Lab Results  Component Value Date   ALT 20 12/04/2017   AST 19 12/04/2017   BILITOT 0.8 12/04/2017    Lab Results  Component Value Date   CHOL 163 12/04/2017   HDL 61 12/04/2017   LDLCALC 88 12/04/2017   TRIG 55 12/04/2017   CHOLHDL 2.7 12/04/2017   HIV 1 RNA Quant  Date Value  01/20/2018 <20 NOT DETECTED copies/mL  12/04/2017 2,630 Copies/mL (H)   CD4 T Cell Abs (/uL)  Date Value  12/04/2017 430   Lab Results  Component Value Date   HAV NON-REACTIVE 12/04/2017   Lab Results  Component Value Date   HEPBSAG NON-REACTIVE 12/04/2017   HEPBSAB REACTIVE (A) 12/04/2017   No results found for: HCVAB Lab Results  Component Value Date   CHLAMYDIAWP Negative 12/04/2017   N Negative 12/04/2017   No results found for: GCPROBEAPT No results found for: QUANTGOLD No results found for: RPR   Assessment & Plan:   Problem List Items Addressed This Visit      Other   HIV (human immunodeficiency virus infection) (HCC) - Primary    Excellent adherence on his Biktarvy - recently undetectable with lab results 1 month prior. We will continue this medication for him. No need for OI prophylaxis. We discussed HMAP enrollment periods today. Discussed U=U concept in addition to safe sex counseling and prevention of other STIs.  He can return to clinic in 4 months for routine care. We can do labs that same day considering his 90 min drive and he uses his mychart. Will check VL/CD4 at that time for HIV monitoring.       Relevant Orders   HIV 1 RNA quant-no reflex-bld   T-helper  cell (CD4)- ([facility] clinic only)   Hepatitis A Ab, Total   History of syphilis    RPR 1:16 345m ago which is 2-fold drop from our first RPR. Not exactly certain what initial treatment titer was. Will re-assess at next visit in 4 months to look for decreasing titer. No signs of re-infection and no new sexual partners.       Relevant Orders   RPR   Healthcare maintenance    Menveo #2 today and Hep A #2 today --> will check Ab at future visit.  Prevnar due 12/2018.  Will discuss HPV at upcoming appointment.          Rexene AlbertsStephanie Khaidyn Staebell,  MSN, NP-C [facility] Pager: [phone] Office: [phone]  03/18/18  9:43 AM

## 2018-04-23 ENCOUNTER — Encounter: Payer: Self-pay | Admitting: Infectious Diseases

## 2018-07-18 ENCOUNTER — Other Ambulatory Visit: Payer: Self-pay

## 2018-07-18 DIAGNOSIS — Z21 Asymptomatic human immunodeficiency virus [HIV] infection status: Secondary | ICD-10-CM

## 2018-07-18 DIAGNOSIS — Z8619 Personal history of other infectious and parasitic diseases: Secondary | ICD-10-CM

## 2018-07-18 LAB — T-HELPER CELL (CD4) - (RCID CLINIC ONLY)
CD4 T CELL HELPER: 29 % — AB (ref 33–55)
CD4 T Cell Abs: 510 /uL (ref 400–2700)

## 2018-07-21 LAB — RPR: RPR Ser Ql: REACTIVE — AB

## 2018-07-21 LAB — HEPATITIS A ANTIBODY, TOTAL: Hepatitis A AB,Total: REACTIVE — AB

## 2018-07-21 LAB — HIV-1 RNA QUANT-NO REFLEX-BLD
HIV 1 RNA QUANT: NOT DETECTED {copies}/mL
HIV-1 RNA Quant, Log: <1.3 Log copies/mL

## 2018-07-21 LAB — RPR TITER: RPR Titer: 1:16 {titer} — ABNORMAL HIGH

## 2018-07-21 LAB — FLUORESCENT TREPONEMAL AB(FTA)-IGG-BLD: FLUORESCENT TREPONEMAL ABS: REACTIVE — AB

## 2018-08-04 ENCOUNTER — Encounter: Payer: Self-pay | Admitting: Infectious Diseases

## 2018-08-04 ENCOUNTER — Ambulatory Visit (INDEPENDENT_AMBULATORY_CARE_PROVIDER_SITE_OTHER): Payer: Self-pay | Admitting: Infectious Diseases

## 2018-08-04 VITALS — BP 109/72 | HR 50 | Temp 97.6°F | Ht 66.0 in | Wt 135.0 lb

## 2018-08-04 DIAGNOSIS — Z23 Encounter for immunization: Secondary | ICD-10-CM

## 2018-08-04 DIAGNOSIS — Z8619 Personal history of other infectious and parasitic diseases: Secondary | ICD-10-CM

## 2018-08-04 DIAGNOSIS — Z0000 Encounter for general adult medical examination without abnormal findings: Secondary | ICD-10-CM

## 2018-08-04 DIAGNOSIS — Z21 Asymptomatic human immunodeficiency virus [HIV] infection status: Secondary | ICD-10-CM

## 2018-08-04 MED ORDER — BICTEGRAVIR-EMTRICITAB-TENOFOV 50-200-25 MG PO TABS: 1.0000 | ORAL_TABLET | Freq: Every day | ORAL | 5 refills | Status: DC

## 2018-08-04 NOTE — Assessment & Plan Note (Signed)
He is doing very well on his Biktarvy and has had durable viral suppression. Will refill. He will d/w pharmacy to mail him medications but will also give 7-day sample today so he has a few extra on hand to avoid lapse. CoPay card provided as well for January when his new insurance starts. He will return in 4 months with labs at that visit for monitoring.

## 2018-08-04 NOTE — Progress Notes (Signed)
Name: Bill Tran  DOB: 07-26-1991 MRN: [id number] PCP: Patient, No Pcp Per    Patient Active Problem List   Diagnosis Date Noted  . HIV (human immunodeficiency virus infection) (HCC) 12/23/2017  . History of syphilis 12/23/2017  . Healthcare maintenance 12/23/2017     Brief Narrative:  Bill Tran is a 27 y.o. male with HIV infection. Originally diagnosed with HIV in 11/2017. History of OIs: none. HIV Risk: MSM.   Previous Regimens:   none  Genotype:   11/2017 - K103N   Subjective:  Bill Tran is a 27 y.o.  is here today for follow up on his HIV care.   He has no complaints or concerns about his health. Still working at [employer] evening/late shifts. Has not been ill since last meeting and "not much changed." He has not missed any doses of his Biktarvy but has cut it close as he drives to [place] from [place] to pick up his medications monthly. He is now eligible for health insurance outside of RW/HMAP and this will start January. Reports no complaints today suggestive of associated opportunistic infection or advancing HIV disease such as fevers, night sweats, weight loss, anorexia, cough, SOB, nausea, vomiting, diarrhea, headache, sensory changes, lymphadenopathy or oral thrush.    He has no other significant pmhx to update today. No new sexual partners since last visit. Questions about syphilis tests he saw on his mychart - recalls getting 3 shot series in March of this year but not certain what his initial titer was.   Review of Systems  Constitutional: Negative for chills, fever, malaise/fatigue and weight loss.  HENT: Negative for sore throat.        No dental problems  Respiratory: Negative for cough and sputum production.   Cardiovascular: Negative for chest pain and leg swelling.  Gastrointestinal: Negative for abdominal pain, diarrhea and vomiting.  Genitourinary: Negative for dysuria and flank pain.  Musculoskeletal: Negative for joint pain, myalgias and neck pain.   Skin: Negative for rash.  Neurological: Negative for dizziness, tingling and headaches.  Psychiatric/Behavioral: Negative for depression and substance abuse. The patient is not nervous/anxious and does not have insomnia.     Past Medical History:  Diagnosis Date  . HIV infection [facility])     Outpatient Medications Prior to Visit  Medication Sig Dispense Refill  . bictegravir-emtricitabine-tenofovir AF (BIKTARVY) 50-200-25 MG TABS tablet Take 1 tablet by mouth daily. Try to take at the same time each day with or without food. 30 tablet 5  . ibuprofen (ADVIL,MOTRIN) 800 MG tablet Take 1 tablet (800 mg total) by mouth 3 (three) times daily. 21 tablet 0   No facility-administered medications prior to visit.      No Known Allergies  Social History   Tobacco Use  . Smoking status: Never Smoker  . Smokeless tobacco: Never Used  Substance Use Topics  . Alcohol use: Yes    Comment: on the weekends  . Drug use: Yes    Types: Marijuana    Comment: daily     Social History   Substance and Sexual Activity  Sexual Activity Not Currently  . Birth control/protection: Condom, None    Objective:   Vitals:   08/04/18 0854  BP: 109/72  Pulse: (!) 50  Temp: 97.6 F (36.4 C)  TempSrc: Oral  Weight: 135 lb (61.2 kg)  Height: 5\' 6"  (1.676 m)   Body mass index is 21.79 kg/m.  Physical Exam  Constitutional: He is oriented to person, place, and  time and well-developed, well-nourished, and in no distress.  Seated in chair comfortably. In good spirits.   HENT:  Mouth/Throat: No oral lesions. Normal dentition. No dental caries.  Eyes: No scleral icterus.  Cardiovascular: Normal rate, regular rhythm and normal heart sounds.  Pulmonary/Chest: Effort normal and breath sounds normal.  Abdominal: Soft. He exhibits no distension. There is no tenderness.  Lymphadenopathy:    He has no cervical adenopathy.  Neurological: He is alert and oriented to person, place, and time.  Skin: Skin is  warm and dry. No rash noted.  Psychiatric: Mood and affect normal.  Vitals reviewed.   Lab Results Lab Results  Component Value Date   WBC 5.2 12/04/2017   HGB 13.8 12/04/2017   HCT 39.7 12/04/2017   MCV 89.2 12/04/2017   PLT 233 12/04/2017    Lab Results  Component Value Date   CREATININE 1.00 01/20/2018   BUN 13 01/20/2018   NA 141 01/20/2018   K 4.1 01/20/2018   CL 106 01/20/2018   CO2 28 01/20/2018    Lab Results  Component Value Date   ALT 20 12/04/2017   AST 19 12/04/2017   BILITOT 0.8 12/04/2017    HIV 1 RNA Quant  Date Value  07/18/2018 <20 NOT DETECTED copies/mL  01/20/2018 <20 NOT DETECTED copies/mL  12/04/2017 2,630 Copies/mL (H)   CD4 T Cell Abs (/uL)  Date Value  07/18/2018 510  12/04/2017 430   Lab Results  Component Value Date   HAV REACTIVE (A) 07/18/2018   Lab Results  Component Value Date   HEPBSAG NON-REACTIVE 12/04/2017   HEPBSAB REACTIVE (A) 12/04/2017   No results found for: HCVAB Lab Results  Component Value Date   CHLAMYDIAWP Negative 12/04/2017   N Negative 12/04/2017    Assessment & Plan:   Problem List Items Addressed This Visit      Unprioritized   Healthcare maintenance    Flu vaccine today. Prevnar due 12/2018. Hep B immune.       History of syphilis    RPR titer 1:16 - will call state lab to get treatment titer to assess for response to tx. He may be serofast at this higher titer. He recalls getting 3 weeks of shots this year for this.       HIV (human immunodeficiency virus infection) (HCC) - Primary    He is doing very well on his Biktarvy and has had durable viral suppression. Will refill. He will d/w pharmacy to mail him medications but will also give 7-day sample today so he has a few extra on hand to avoid lapse. CoPay card provided as well for January when his new insurance starts. He will return in 4 months with labs at that visit for monitoring.       Relevant Medications    bictegravir-emtricitabine-tenofovir AF (BIKTARVY) 50-200-25 MG TABS tablet     Rexene Alberts, MSN, NP-C [facility] Pager: [phone] Office: [phone]  08/04/18  9:32 AM

## 2018-08-04 NOTE — Assessment & Plan Note (Signed)
Flu vaccine today. Prevnar due 12/2018. Hep B immune.

## 2018-08-04 NOTE — Patient Instructions (Addendum)
Please continue your Biktarvy once a day - your labs look great and you are undetectable.   Please discuss your insurance change with the pharmacy team at [facility] - for now they can mail you your medication until January.   Please call us right away to help if you have any trouble with getting or affording your medications - I gave you a copay card today.   Will see you back in 4 months with labs at that visit.

## 2018-08-04 NOTE — Assessment & Plan Note (Signed)
RPR titer 1:16 - will call state lab to get treatment titer to assess for response to tx. He may be serofast at this higher titer. He recalls getting 3 weeks of shots this year for this.

## 2018-12-04 ENCOUNTER — Other Ambulatory Visit: Payer: Self-pay

## 2018-12-04 ENCOUNTER — Ambulatory Visit (INDEPENDENT_AMBULATORY_CARE_PROVIDER_SITE_OTHER): Payer: Self-pay | Admitting: Infectious Diseases

## 2018-12-04 ENCOUNTER — Encounter: Payer: Self-pay | Admitting: Infectious Diseases

## 2018-12-04 DIAGNOSIS — Z113 Encounter for screening for infections with a predominantly sexual mode of transmission: Secondary | ICD-10-CM

## 2018-12-04 DIAGNOSIS — Z0000 Encounter for general adult medical examination without abnormal findings: Secondary | ICD-10-CM

## 2018-12-04 DIAGNOSIS — Z8619 Personal history of other infectious and parasitic diseases: Secondary | ICD-10-CM

## 2018-12-04 DIAGNOSIS — Z21 Asymptomatic human immunodeficiency virus [HIV] infection status: Secondary | ICD-10-CM

## 2018-12-04 NOTE — Assessment & Plan Note (Signed)
He has been well maintained on his Biktarvy for 1 year now. Will repeat VL/CD4, CBC, CMET today and he can return in 6 months.  Declined condoms, declined STI testing.  Discussed U=U concept in addition to safe sex counseling and prevention of other STIs.   RTC in 88m. Can do labs at the visit as he is working night shift and uses MyChart regularly.

## 2018-12-04 NOTE — Assessment & Plan Note (Signed)
Prevnar due in April 2020. Will also start Hep A vaccines at upcoming appointment.

## 2018-12-04 NOTE — Progress Notes (Signed)
Name: Bill Tran  DOB: 01-22-1991 MRN: [id number] PCP: Patient, No Pcp Per    Patient Active Problem List   Diagnosis Date Noted  . HIV (human immunodeficiency virus infection) ([place]) 12/23/2017  . History of syphilis 12/23/2017  . Healthcare maintenance 12/23/2017     Brief Narrative:  Bill Tran is a 28 y.o. male with HIV infection. Originally diagnosed with HIV in 11/2017.  History of OIs: none. HIV Risk: MSM.  11/2017: Hep B sAg (-) / sAb (+) / cAb (-); Hep A (-), Hep C (-), Quantiferon (-), HLA B*5701 (-)  Previous Regimens:   Biktarvy >> suppressed   Genotype:   11/2017 - K103N   Subjective:  CC:  Follow up for HIV care. No complaints/concerns.   HPI: Bill Tran is doing very well. He has a new job working as Location manager at [employer] and still part time at [employer]. He is still taking his Biktarvy every morning as he is still awake at the same time of day and reports no missed doses. He has no concerns over any potential side effects. He will have a change in insurance in 1 month through his new job. He is now exercising more and riding his bike while his dog runs. He has not been sexually active since our last office visit with any new or previous partners.    Review of Systems  Constitutional: Negative for chills, fever, malaise/fatigue and weight loss.  HENT: Negative for sore throat.        No dental problems  Respiratory: Negative for cough and sputum production.   Cardiovascular: Negative for chest pain and leg swelling.  Gastrointestinal: Negative for abdominal pain, diarrhea and vomiting.  Genitourinary: Negative for dysuria and flank pain.  Musculoskeletal: Negative for joint pain, myalgias and neck pain.  Skin: Negative for rash.  Neurological: Negative for dizziness, tingling and headaches.  Psychiatric/Behavioral: Negative for depression and substance abuse. The patient is not nervous/anxious and does not have insomnia.     Past Medical  History:  Diagnosis Date  . HIV infection [facility])     Outpatient Medications Prior to Visit  Medication Sig Dispense Refill  . bictegravir-emtricitabine-tenofovir AF (BIKTARVY) 50-200-25 MG TABS tablet Take 1 tablet by mouth daily. Try to take at the same time each day with or without food. 30 tablet 5  . ibuprofen (ADVIL,MOTRIN) 800 MG tablet Take 1 tablet (800 mg total) by mouth 3 (three) times daily. 21 tablet 0   No facility-administered medications prior to visit.      No Known Allergies  Social History   Tobacco Use  . Smoking status: Never Smoker  . Smokeless tobacco: Never Used  Substance Use Topics  . Alcohol use: Yes    Comment: on the weekends  . Drug use: Yes    Types: Marijuana    Comment: daily     Social History   Substance and Sexual Activity  Sexual Activity Not Currently  . Birth control/protection: Condom, None    Objective:   There were no vitals filed for this visit. There is no height or weight on file to calculate BMI.  Physical Exam  Constitutional: He is oriented to person, place, and time and well-developed, well-nourished, and in no distress.  Seated in chair comfortably. In good spirits.   HENT:  Mouth/Throat: No oral lesions. Normal dentition. No dental caries.  Eyes: No scleral icterus.  Cardiovascular: Normal rate, regular rhythm and normal heart sounds.  Pulmonary/Chest: Effort normal and  breath sounds normal.  Abdominal: Soft. He exhibits no distension. There is no abdominal tenderness.  Lymphadenopathy:    He has no cervical adenopathy.  Neurological: He is alert and oriented to person, place, and time.  Skin: Skin is warm and dry. No rash noted.  Psychiatric: Mood and affect normal.  Vitals reviewed.   Lab Results Lab Results  Component Value Date   WBC 5.2 12/04/2017   HGB 13.8 12/04/2017   HCT 39.7 12/04/2017   MCV 89.2 12/04/2017   PLT 233 12/04/2017    Lab Results  Component Value Date   CREATININE 1.00  01/20/2018   BUN 13 01/20/2018   NA 141 01/20/2018   K 4.1 01/20/2018   CL 106 01/20/2018   CO2 28 01/20/2018    Lab Results  Component Value Date   ALT 20 12/04/2017   AST 19 12/04/2017   BILITOT 0.8 12/04/2017    HIV 1 RNA Quant  Date Value  07/18/2018 <20 NOT DETECTED copies/mL  01/20/2018 <20 NOT DETECTED copies/mL  12/04/2017 2,630 Copies/mL (H)   CD4 T Cell Abs (/uL)  Date Value  07/18/2018 510  12/04/2017 430   Lab Results  Component Value Date   HAV REACTIVE (A) 07/18/2018   Lab Results  Component Value Date   HEPBSAG NON-REACTIVE 12/04/2017   HEPBSAB REACTIVE (A) 12/04/2017   No results found for: HCVAB Lab Results  Component Value Date   CHLAMYDIAWP Negative 12/04/2017   N Negative 12/04/2017    Assessment & Plan:   Problem List Items Addressed This Visit      Unprioritized   HIV (human immunodeficiency virus infection) ([place]) - Primary (Chronic)    He has been well maintained on his [place] for 1 year now. Will repeat VL/CD4, CBC, CMET today and he can return in 6 months.  Declined condoms, declined STI testing.  Discussed U=U concept in addition to safe sex counseling and prevention of other STIs.   RTC in 58m Can do labs at the visit as he is working night shift and uses MyChart regularly.       Relevant Orders   T-helper cell (CD4)- ([facility] clinic only)   HIV-1 RNA quant-no reflex-bld   COMPLETE METABOLIC PANEL WITH GFR   CBC with Differential/Platelet   History of syphilis    Will repeat RPR titer today to follow. Was treated for latent syphilis of unknown duration and last titer 1:16.       Healthcare maintenance    Prevnar due in April 2020. Will also start Hep A vaccines at upcoming appointment.        Other Visit Diagnoses    Screening examination for venereal disease       Relevant Orders   RPR     SJanene Madeira MSN, NP-C RIndiana University Health North Hospitalfor Infectious DOldtownPager: [phone]:  [phone] 12/04/18  11:27 AM

## 2018-12-04 NOTE — Assessment & Plan Note (Signed)
Will repeat RPR titer today to follow. Was treated for latent syphilis of unknown duration and last titer 1:16.

## 2018-12-05 ENCOUNTER — Telehealth: Payer: Self-pay | Admitting: Behavioral Health

## 2018-12-05 LAB — T-HELPER CELL (CD4) - (RCID CLINIC ONLY)
CD4 T CELL ABS: 800 /uL (ref 400–2700)
CD4 T CELL HELPER: 36 % (ref 33–55)

## 2018-12-05 NOTE — Progress Notes (Signed)
Please call Bill Tran to tell him that his syphilis titer is higher indicating re-infection. He needs to come in for a shot of bicillin 2.4 million units x 1 dose.  I would also like for him to do oral/rectal/urine swabs to check other STDs please.

## 2018-12-05 NOTE — Telephone Encounter (Signed)
Called patient, left a voicemail for him to call the office back and left the call back number.  Will try again Monday 12/08/2018. Angeline Slim RN

## 2018-12-05 NOTE — Telephone Encounter (Signed)
-----   Message from Blanchard Kelch, NP sent at 12/05/2018  3:08 PM EDT ----- Please call Chrystopher to tell him that his syphilis titer is higher indicating re-infection. He needs to come in for a shot of bicillin 2.4 million units x 1 dose.  I would also like for him to do oral/rectal/urine swabs to check other STDs please.

## 2018-12-08 LAB — CBC WITH DIFFERENTIAL/PLATELET
ABSOLUTE MONOCYTES: 519 {cells}/uL (ref 200–950)
BASOS PCT: 1.2 %
Basophils Absolute: 59 cells/uL (ref 0–200)
Eosinophils Absolute: 39 cells/uL (ref 15–500)
Eosinophils Relative: 0.8 %
HEMATOCRIT: 41.8 % (ref 38.5–50.0)
Hemoglobin: 14.7 g/dL (ref 13.2–17.1)
LYMPHS ABS: 2156 {cells}/uL (ref 850–3900)
MCH: 33.3 pg — ABNORMAL HIGH (ref 27.0–33.0)
MCHC: 35.2 g/dL (ref 32.0–36.0)
MCV: 94.6 fL (ref 80.0–100.0)
MPV: 10.1 fL (ref 7.5–12.5)
Monocytes Relative: 10.6 %
NEUTROS ABS: 2127 {cells}/uL (ref 1500–7800)
Neutrophils Relative %: 43.4 %
Platelets: 218 10*3/uL (ref 140–400)
RBC: 4.42 10*6/uL (ref 4.20–5.80)
RDW: 13 % (ref 11.0–15.0)
Total Lymphocyte: 44 %
WBC: 4.9 10*3/uL (ref 3.8–10.8)

## 2018-12-08 LAB — COMPLETE METABOLIC PANEL WITH GFR
AG Ratio: 1.6 (calc) (ref 1.0–2.5)
ALBUMIN MSPROF: 4.5 g/dL (ref 3.6–5.1)
ALKALINE PHOSPHATASE (APISO): 47 U/L (ref 36–130)
ALT: 12 U/L (ref 9–46)
AST: 18 U/L (ref 10–40)
BUN: 9 mg/dL (ref 7–25)
CO2: 30 mmol/L (ref 20–32)
CREATININE: 1 mg/dL (ref 0.60–1.35)
Calcium: 10.1 mg/dL (ref 8.6–10.3)
Chloride: 104 mmol/L (ref 98–110)
GFR, Est African American: 119 mL/min/{1.73_m2} (ref >=60)
GFR, Est Non African American: 103 mL/min/{1.73_m2} (ref >=60)
GLUCOSE: 97 mg/dL (ref 65–99)
Globulin: 2.8 g/dL (calc) (ref 1.9–3.7)
Potassium: 4.3 mmol/L (ref 3.5–5.3)
Sodium: 140 mmol/L (ref 135–146)
Total Bilirubin: 0.9 mg/dL (ref 0.2–1.2)
Total Protein: 7.3 g/dL (ref 6.1–8.1)

## 2018-12-08 LAB — RPR TITER: RPR Titer: 1:64 {titer} — ABNORMAL HIGH

## 2018-12-08 LAB — HIV-1 RNA QUANT-NO REFLEX-BLD
HIV 1 RNA QUANT: NOT DETECTED {copies}/mL
HIV-1 RNA QUANT, LOG: NOT DETECTED {Log_copies}/mL

## 2018-12-08 LAB — FLUORESCENT TREPONEMAL AB(FTA)-IGG-BLD: FLUORESCENT TREPONEMAL ABS: REACTIVE — AB

## 2018-12-08 LAB — RPR: RPR Ser Ql: REACTIVE — AB

## 2018-12-08 NOTE — Telephone Encounter (Signed)
Called patient left message to call the office back ASAP.  Left the call back number.  Will try again to call him back. Angeline Slim RN

## 2018-12-10 NOTE — Telephone Encounter (Signed)
Thank you for [facility] referral.

## 2018-12-10 NOTE — Telephone Encounter (Signed)
Left message asking patient to call his doctor's office back regarding lab results.  Will prepare documentation to refer him to [facility] for treatment. Andree Coss, RN

## 2018-12-11 NOTE — Telephone Encounter (Signed)
Referral faxed to [facility].  Unable to reach patint via phone.  Mathis Fare is aware of incoming fax. Angeline Slim RN

## 2019-02-16 ENCOUNTER — Other Ambulatory Visit: Payer: Self-pay | Admitting: Infectious Diseases

## 2019-04-01 ENCOUNTER — Encounter: Payer: Self-pay | Admitting: Infectious Diseases

## 2019-04-01 ENCOUNTER — Telehealth: Payer: Self-pay | Admitting: Pharmacy Technician

## 2019-04-01 ENCOUNTER — Other Ambulatory Visit: Payer: Self-pay

## 2019-04-01 ENCOUNTER — Ambulatory Visit: Payer: Self-pay

## 2019-04-01 NOTE — Telephone Encounter (Signed)
[facility] Patient Advocate Encounter   Patient has been approved for [employer] Advancing Access Patient Assistance Program for [place]  from 30-day coverage. This assistance will make the patient's copay $0.  Patient has a financial appointment at 3:30 to renew ADAP and will take the 30-day card to [facility].  The billing information is as follows: Member ID: [id number] [place]: [id number] PCN: [id number] Group: [id number]  Patient knows to call the office with questions or concerns.  Bartholomew Crews, CPhT Specialty Pharmacy Patient [facility] for Infectious Disease Phone: [phone] Fax: [phone] 04/01/2019 11:51 AM

## 2019-04-20 ENCOUNTER — Ambulatory Visit: Payer: Self-pay | Admitting: Infectious Diseases

## 2019-08-23 ENCOUNTER — Other Ambulatory Visit: Payer: Self-pay | Admitting: Infectious Diseases

## 2019-08-24 ENCOUNTER — Telehealth: Payer: Self-pay

## 2019-08-24 NOTE — Telephone Encounter (Signed)
Attempted to call patient to schedule overdue appointment with our office. Unable to speak with patient directly. Left voicemail requesting patient call office back. [place]

## 2020-02-05 ENCOUNTER — Other Ambulatory Visit: Payer: Self-pay | Admitting: Infectious Diseases

## 2020-02-09 ENCOUNTER — Other Ambulatory Visit: Payer: Self-pay

## 2020-02-09 ENCOUNTER — Encounter: Payer: Self-pay | Admitting: Infectious Diseases

## 2020-02-09 DIAGNOSIS — B20 Human immunodeficiency virus [HIV] disease: Secondary | ICD-10-CM

## 2020-02-09 DIAGNOSIS — Z79899 Other long term (current) drug therapy: Secondary | ICD-10-CM

## 2020-02-09 DIAGNOSIS — Z113 Encounter for screening for infections with a predominantly sexual mode of transmission: Secondary | ICD-10-CM

## 2020-02-10 LAB — T-HELPER CELL (CD4) - (RCID CLINIC ONLY)
CD4 % Helper T Cell: 42 % (ref 33–65)
CD4 T Cell Abs: 905 /uL (ref 400–1790)

## 2020-02-11 LAB — CBC WITH DIFFERENTIAL/PLATELET
Absolute Monocytes: 578 cells/uL (ref 200–950)
Basophils Absolute: 59 cells/uL (ref 0–200)
Basophils Relative: 1 %
Eosinophils Absolute: 71 cells/uL (ref 15–500)
Eosinophils Relative: 1.2 %
HCT: 43.4 % (ref 38.5–50.0)
Hemoglobin: 14.9 g/dL (ref 13.2–17.1)
Lymphs Abs: 2142 cells/uL (ref 850–3900)
MCH: 33.3 pg — ABNORMAL HIGH (ref 27.0–33.0)
MCHC: 34.3 g/dL (ref 32.0–36.0)
MCV: 97.1 fL (ref 80.0–100.0)
MPV: 9.7 fL (ref 7.5–12.5)
Monocytes Relative: 9.8 %
Neutro Abs: 3050 cells/uL (ref 1500–7800)
Neutrophils Relative %: 51.7 %
Platelets: 241 10*3/uL (ref 140–400)
RBC: 4.47 10*6/uL (ref 4.20–5.80)
RDW: 13.3 % (ref 11.0–15.0)
Total Lymphocyte: 36.3 %
WBC: 5.9 10*3/uL (ref 3.8–10.8)

## 2020-02-11 LAB — COMPREHENSIVE METABOLIC PANEL
AG Ratio: 1.6 (calc) (ref 1.0–2.5)
ALT: 20 U/L (ref 9–46)
AST: 21 U/L (ref 10–40)
Albumin: 4.4 g/dL (ref 3.6–5.1)
Alkaline phosphatase (APISO): 51 U/L (ref 36–130)
BUN: 8 mg/dL (ref 7–25)
CO2: 27 mmol/L (ref 20–32)
Calcium: 9.6 mg/dL (ref 8.6–10.3)
Chloride: 105 mmol/L (ref 98–110)
Creat: 0.96 mg/dL (ref 0.60–1.35)
Globulin: 2.7 g/dL (calc) (ref 1.9–3.7)
Glucose, Bld: 96 mg/dL (ref 65–99)
Potassium: 3.8 mmol/L (ref 3.5–5.3)
Sodium: 140 mmol/L (ref 135–146)
Total Bilirubin: 0.8 mg/dL (ref 0.2–1.2)
Total Protein: 7.1 g/dL (ref 6.1–8.1)

## 2020-02-11 LAB — LIPID PANEL
Cholesterol: 181 mg/dL (ref <200)
HDL: 83 mg/dL (ref >=40)
LDL Cholesterol (Calc): 82 mg/dL (calc)
Non-HDL Cholesterol (Calc): 98 mg/dL (calc) (ref <130)
Total CHOL/HDL Ratio: 2.2 (calc) (ref <5.0)
Triglycerides: 80 mg/dL (ref <150)

## 2020-02-11 LAB — HIV-1 RNA QUANT-NO REFLEX-BLD
HIV 1 RNA Quant: <20 copies/mL
HIV-1 RNA Quant, Log: <1.3 Log copies/mL

## 2020-02-11 LAB — RPR TITER: RPR Titer: 1:8 {titer} — ABNORMAL HIGH

## 2020-02-11 LAB — FLUORESCENT TREPONEMAL AB(FTA)-IGG-BLD: Fluorescent Treponemal ABS: REACTIVE — AB

## 2020-02-11 LAB — RPR: RPR Ser Ql: REACTIVE — AB

## 2020-02-29 ENCOUNTER — Ambulatory Visit: Payer: Self-pay | Admitting: Infectious Diseases

## 2020-05-24 ENCOUNTER — Other Ambulatory Visit: Payer: Self-pay | Admitting: Infectious Diseases

## 2020-06-15 ENCOUNTER — Telehealth: Payer: Self-pay

## 2020-06-15 DIAGNOSIS — B20 Human immunodeficiency virus [HIV] disease: Secondary | ICD-10-CM

## 2020-06-15 MED ORDER — BIKTARVY 50-200-25 MG PO TABS: 1.0000 | ORAL_TABLET | Freq: Every day | ORAL | 0 refills | Status: DC

## 2020-06-15 NOTE — Telephone Encounter (Signed)
Patient called office today requesting refills on Biktarvy states he only has two days left. Refill history per pharmacy is  04/27/20,01/10/20,3/21,2/21,1/21. Per RN okay to give patient 30 day supply. Will need to keep upcoming appointment for future refills. Lorenso Courier, [place]

## 2020-06-27 ENCOUNTER — Ambulatory Visit: Payer: Self-pay

## 2020-06-27 ENCOUNTER — Other Ambulatory Visit: Payer: Self-pay

## 2020-06-27 ENCOUNTER — Ambulatory Visit (INDEPENDENT_AMBULATORY_CARE_PROVIDER_SITE_OTHER): Payer: Self-pay | Admitting: Pharmacist

## 2020-06-27 DIAGNOSIS — B20 Human immunodeficiency virus [HIV] disease: Secondary | ICD-10-CM

## 2020-06-27 MED ORDER — BIKTARVY 50-200-25 MG PO TABS: 1.0000 | ORAL_TABLET | Freq: Every day | ORAL | 6 refills | Status: AC

## 2020-06-27 NOTE — Progress Notes (Signed)
HPI: Bill Tran is a 29 y.o. male who presents to the [facility] pharmacy clinic for HIV follow-up.  Patient Active Problem List   Diagnosis Date Noted  . HIV (human immunodeficiency virus infection) ([facility]) 12/23/2017  . History of syphilis 12/23/2017  . Healthcare maintenance 12/23/2017    Patient's Medications  New Prescriptions   No medications on file  Previous Medications   BICTEGRAVIR-EMTRICITABINE-TENOFOVIR AF (BIKTARVY) 50-200-25 MG TABS TABLET    Take 1 tablet by mouth daily.   IBUPROFEN (ADVIL,MOTRIN) 800 MG TABLET    Take 1 tablet (800 mg total) by mouth 3 (three) times daily.  Modified Medications   No medications on file  Discontinued Medications   No medications on file    Allergies: No Known Allergies  Past Medical History: Past Medical History:  Diagnosis Date  . HIV infection [facility])     Social History: Social History   Socioeconomic History  . Marital status: Single    Spouse name: Not on file  . Number of children: Not on file  . Years of education: Not on file  . Highest education level: Not on file  Occupational History  . Not on file  Tobacco Use  . Smoking status: Never Smoker  . Smokeless tobacco: Never Used  Vaping Use  . Vaping Use: Some days  . Start date: 07/25/2016  . Substances: Flavoring  Substance and Sexual Activity  . Alcohol use: Yes    Comment: on the weekends  . Drug use: Yes    Types: Marijuana    Comment: daily   . Sexual activity: Not Currently    Birth control/protection: Condom, None  Other Topics Concern  . Not on file  Social History Narrative  . Not on file   Social Determinants of Health   Financial Resource Strain:   . Difficulty of Paying Living Expenses: Not on file  Food Insecurity:   . Worried About Programme researcher, broadcasting/film/video in the Last Year: Not on file  . Ran Out of Food in the Last Year: Not on file  Transportation Needs:   . Lack of Transportation (Medical): Not on file  . Lack of Transportation  (Non-Medical): Not on file  Physical Activity:   . Days of Exercise per Week: Not on file  . Minutes of Exercise per Session: Not on file  Stress:   . Feeling of Stress : Not on file  Social Connections:   . Frequency of Communication with Friends and Family: Not on file  . Frequency of Social Gatherings with Friends and Family: Not on file  . Attends Religious Services: Not on file  . Active Member of Clubs or Organizations: Not on file  . Attends Banker Meetings: Not on file  . Marital Status: Not on file    Labs: Lab Results  Component Value Date   HIV1RNAQUANT <20 NOT DETECTED 02/09/2020   HIV1RNAQUANT <20 NOT DETECTED 12/04/2018   HIV1RNAQUANT <20 NOT DETECTED 07/18/2018   CD4TABS 905 02/09/2020   CD4TABS 800 12/04/2018   CD4TABS 510 07/18/2018    RPR and STI Lab Results  Component Value Date   LABRPR REACTIVE (A) 02/09/2020   LABRPR REACTIVE (A) 12/04/2018   LABRPR REACTIVE (A) 07/18/2018   LABRPR REACTIVE (A) 01/20/2018   LABRPR REACTIVE (A) 12/04/2017   RPRTITER 1:8 (H) 02/09/2020   RPRTITER 1:64 (H) 12/04/2018   RPRTITER 1:16 (H) 07/18/2018   RPRTITER 1:16 (H) 01/20/2018   RPRTITER 1:32 (H) 12/04/2017    STI  Results GC CT  12/04/2017 Negative Negative    Hepatitis B Lab Results  Component Value Date   HEPBSAB REACTIVE (A) 12/04/2017   HEPBSAG NON-REACTIVE 12/04/2017   HEPBCAB NON-REACTIVE 12/04/2017   Hepatitis C Lab Results  Component Value Date   HEPCAB NON-REACTIVE 12/04/2017   Hepatitis A Lab Results  Component Value Date   HAV REACTIVE (A) 07/18/2018   Lipids: Lab Results  Component Value Date   CHOL 181 02/09/2020   TRIG 80 02/09/2020   HDL 83 02/09/2020   CHOLHDL 2.2 02/09/2020   LDLCALC 82 02/09/2020    Current HIV Regimen: Biktarvy  Assessment: Bill Tran is here today to follow up for his HIV infection after several missed appointments with Judeth Cornfield. He was last seen on 12/04/2018 where he was taking Biktarvy and  had an undetectable HIV viral load.  He did come in a few months ago in May and had labs drawn again that showed an undetectable HIV viral load as well.  He came in today to renew his HMAP. He was able to get a refill right before it expired on 9/30, so he has about 3 weeks of medication left. He takes it every day and reports no missed doses. He does not take any other medications and tolerates Biktarvy without side effects. He is not working right now. No issues today or concerns. I will check his viral load again and have him see Judeth Cornfield after the beginning of the year. He will need to renew HMAP at that time again. He is to call or mychart me if he needs more Biktarvy before his HMAP is active again.    Plan: - Biktarvy refills sent - HIV viral load today - F/u with Judeth Cornfield 1/18 at 11am  Lucynda Rosano L. Jannette Fogo, PharmD, BCIDP, AAHIVP, CPP Clinical Pharmacist Practitioner Infectious Diseases Clinical Pharmacist [facility] 06/27/2020, 11:56 AM

## 2020-06-29 LAB — HIV-1 RNA QUANT-NO REFLEX-BLD
HIV 1 RNA Quant: <20 Copies/mL
HIV-1 RNA Quant, Log: <1.3 Log cps/mL

## 2020-07-08 ENCOUNTER — Encounter: Payer: Self-pay | Admitting: Infectious Diseases

## 2020-07-16 ENCOUNTER — Other Ambulatory Visit: Payer: Self-pay | Admitting: Pharmacist

## 2020-07-16 DIAGNOSIS — B20 Human immunodeficiency virus [HIV] disease: Secondary | ICD-10-CM

## 2020-10-11 ENCOUNTER — Ambulatory Visit: Payer: Self-pay | Admitting: Infectious Diseases

## 2020-10-11 ENCOUNTER — Ambulatory Visit: Payer: Self-pay

## 2020-10-11 ENCOUNTER — Other Ambulatory Visit: Payer: Self-pay

## 2020-11-01 ENCOUNTER — Ambulatory Visit: Payer: Self-pay | Admitting: Infectious Diseases

## 2020-11-01 ENCOUNTER — Telehealth: Payer: Self-pay

## 2020-11-01 NOTE — Telephone Encounter (Signed)
Called patient to confirm appointment for today. Patient states that he has relocated to [place] to help take care of his mother. Is currently not in care, but has been looking for new provider.  Patient is aware that he will need to have a signed release of information before any records can be faxed to new provider. Will call office once he is in care.  Will call if any additional assistance is needed.
